# Patient Record
Sex: Female | Born: 1955 | Race: Black or African American | Hispanic: No | Marital: Single | Smoking: Never smoker
Health system: Southern US, Community
[De-identification: ages and names within clinical notes are randomized; demographics above are authoritative.]

## PROBLEM LIST (undated history)

## (undated) DIAGNOSIS — R259 Unspecified abnormal involuntary movements: Secondary | ICD-10-CM

## (undated) DIAGNOSIS — G809 Cerebral palsy, unspecified: Secondary | ICD-10-CM

## (undated) DIAGNOSIS — Z7984 Long term (current) use of oral hypoglycemic drugs: Secondary | ICD-10-CM

## (undated) DIAGNOSIS — E119 Type 2 diabetes mellitus without complications: Secondary | ICD-10-CM

## (undated) DIAGNOSIS — I10 Essential (primary) hypertension: Secondary | ICD-10-CM

## (undated) HISTORY — DX: Morbid (severe) obesity due to excess calories: E66.01

## (undated) HISTORY — DX: Long term (current) use of oral hypoglycemic drugs: Z79.84

## (undated) HISTORY — DX: Cerebral palsy, unspecified: G80.9

## (undated) HISTORY — PX: SALPINGOOPHORECTOMY: SHX82

## (undated) HISTORY — PX: ABDOMINAL HYSTERECTOMY: SHX81

---

## 2005-02-04 ENCOUNTER — Ambulatory Visit: Payer: Self-pay | Admitting: Family Medicine

## 2005-10-22 ENCOUNTER — Ambulatory Visit: Payer: Self-pay | Admitting: Unknown Physician Specialty

## 2006-09-07 ENCOUNTER — Ambulatory Visit: Payer: Self-pay | Admitting: Internal Medicine

## 2008-02-10 ENCOUNTER — Ambulatory Visit: Payer: Self-pay | Admitting: Internal Medicine

## 2009-03-15 ENCOUNTER — Emergency Department: Payer: Self-pay | Admitting: Internal Medicine

## 2010-03-25 ENCOUNTER — Emergency Department: Payer: Self-pay | Admitting: Emergency Medicine

## 2010-05-13 ENCOUNTER — Ambulatory Visit: Payer: Self-pay | Admitting: Internal Medicine

## 2011-06-16 DIAGNOSIS — E785 Hyperlipidemia, unspecified: Secondary | ICD-10-CM | POA: Insufficient documentation

## 2011-06-16 DIAGNOSIS — G809 Cerebral palsy, unspecified: Secondary | ICD-10-CM | POA: Insufficient documentation

## 2011-06-16 DIAGNOSIS — I10 Essential (primary) hypertension: Secondary | ICD-10-CM | POA: Insufficient documentation

## 2011-06-16 DIAGNOSIS — R32 Unspecified urinary incontinence: Secondary | ICD-10-CM | POA: Insufficient documentation

## 2012-02-02 ENCOUNTER — Ambulatory Visit: Payer: Self-pay | Admitting: Family Medicine

## 2012-04-08 ENCOUNTER — Emergency Department: Payer: Self-pay | Admitting: Emergency Medicine

## 2013-06-17 ENCOUNTER — Ambulatory Visit: Payer: Self-pay | Admitting: Family Medicine

## 2014-01-25 DIAGNOSIS — Q669 Congenital deformity of feet, unspecified, unspecified foot: Secondary | ICD-10-CM | POA: Insufficient documentation

## 2014-08-16 DIAGNOSIS — G250 Essential tremor: Secondary | ICD-10-CM | POA: Insufficient documentation

## 2014-08-26 ENCOUNTER — Emergency Department: Payer: Self-pay | Admitting: Emergency Medicine

## 2016-02-12 ENCOUNTER — Other Ambulatory Visit: Payer: Self-pay | Admitting: Family Medicine

## 2016-02-12 DIAGNOSIS — Z1231 Encounter for screening mammogram for malignant neoplasm of breast: Secondary | ICD-10-CM

## 2016-02-29 ENCOUNTER — Ambulatory Visit
Admission: RE | Admit: 2016-02-29 | Discharge: 2016-02-29 | Disposition: A | Discharge status: Home / Self Care | Payer: Medicare Other | Source: Ambulatory Visit | Attending: Family Medicine | Admitting: Family Medicine

## 2016-02-29 DIAGNOSIS — Z1231 Encounter for screening mammogram for malignant neoplasm of breast: Secondary | ICD-10-CM | POA: Diagnosis present

## 2016-04-03 ENCOUNTER — Emergency Department
Admission: EM | Admit: 2016-04-03 | Discharge: 2016-04-03 | Disposition: A | Discharge status: Home / Self Care | Payer: Medicare Other | Attending: Emergency Medicine | Admitting: Emergency Medicine

## 2016-04-03 ENCOUNTER — Encounter: Payer: Self-pay | Admitting: Emergency Medicine

## 2016-04-03 DIAGNOSIS — Z048 Encounter for examination and observation for other specified reasons: Secondary | ICD-10-CM | POA: Diagnosis present

## 2016-04-03 DIAGNOSIS — E785 Hyperlipidemia, unspecified: Secondary | ICD-10-CM | POA: Diagnosis not present

## 2016-04-03 DIAGNOSIS — Y999 Unspecified external cause status: Secondary | ICD-10-CM | POA: Insufficient documentation

## 2016-04-03 DIAGNOSIS — Y9289 Other specified places as the place of occurrence of the external cause: Secondary | ICD-10-CM | POA: Insufficient documentation

## 2016-04-03 DIAGNOSIS — Y939 Activity, unspecified: Secondary | ICD-10-CM | POA: Diagnosis not present

## 2016-04-03 DIAGNOSIS — Z8669 Personal history of other diseases of the nervous system and sense organs: Secondary | ICD-10-CM | POA: Insufficient documentation

## 2016-04-03 DIAGNOSIS — I10 Essential (primary) hypertension: Secondary | ICD-10-CM | POA: Insufficient documentation

## 2016-04-03 DIAGNOSIS — W050XXA Fall from non-moving wheelchair, initial encounter: Secondary | ICD-10-CM | POA: Insufficient documentation

## 2016-04-03 DIAGNOSIS — W19XXXA Unspecified fall, initial encounter: Secondary | ICD-10-CM

## 2016-04-03 DIAGNOSIS — E119 Type 2 diabetes mellitus without complications: Secondary | ICD-10-CM | POA: Insufficient documentation

## 2016-04-03 HISTORY — DX: Essential (primary) hypertension: I10

## 2016-04-03 HISTORY — DX: Unspecified abnormal involuntary movements: R25.9

## 2016-04-03 HISTORY — DX: Type 2 diabetes mellitus without complications: E11.9

## 2016-04-03 HISTORY — DX: Cerebral palsy, unspecified: G80.9

## 2016-04-03 NOTE — Discharge Instructions (Signed)

## 2016-04-03 NOTE — ED Provider Notes (Signed)
[facility] Emergency Department Provider Note   ____________________________________________  Time seen: Approximately 7:36 AM  I have reviewed the triage vital signs and the nursing notes.   HISTORY  Chief Complaint Fall and Diarrhea    HPI Sarah Landry is a 60 y.o. female from [facility] who had an unwitnessed fall. Patient had an episode of loose stool when she was picked up by EMS. Here patient has no complaints. Patient's been waiting for over an hour has not had a stool here I checked. She has no complaints of pain anywhere either and is acting normally. I will discharge her home.  Past Medical History  Diagnosis Date  . Diabetes mellitus without complication (HCC)   . Hypertension   . Cerebral palsy, unspecified (HCC)   . 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   History reviewed. No pertinent past surgical history.  No current outpatient prescriptions on file.  Allergies Review of patient's allergies indicates no known allergies.  Family History  Problem Relation Age of Onset  . Breast cancer Neg Hx     Social History Social History  Substance Use Topics  . Smoking status: None  . Smokeless tobacco: None  . Alcohol Use: None    Review of Systems Constitutional: No fever/chills Eyes: No visual changes. ENT: No sore throat. Cardiovascular: Denies chest pain. Respiratory: Denies shortness of breath. Gastrointestinal: No abdominal pain.  No nausea, no vomiting.  No diarrhea.  No  constipation. Genitourinary: Negative for dysuria. Musculoskeletal: Negative for back pain. Skin: Negative for rash. Neurological: Negative for headaches, focal weakness or numbness.  10-point ROS otherwise negative.  ____________________________________________   PHYSICAL EXAM:  VITAL SIGNS: ED Triage Vitals  Enc Vitals Group     BP 04/03/16 0621 154/85 mmHg     Pulse Rate 04/03/16 0621 85     Resp 04/03/16 0621 17     Temp 04/03/16 0621 97.7 F (36.5 C)     Temp Source 04/03/16 0621 Oral     SpO2 04/03/16 0621 97 %     Weight 04/03/16 0621 235 lb 11.2 oz (106.913 kg)     Height 04/03/16 0621 5' (1.524 m)     Head Cir --      Peak Flow --      Pain Score 04/03/16 0618 0     Pain Loc --      Pain Edu? --      Excl. in GC? --     Constitutional: Alert and oriented. Well appearing and in no acute distress. Eyes: Conjunctivae are normal. PERRL. EOMI. Head: Atraumatic. Nose: No congestion/rhinnorhea. Mouth/Throat: Mucous membranes are moist.  Oropharynx non-erythematous. Neck: No stridor.  No cervical spine tenderness to palpation. Cardiovascular: Normal rate, regular rhythm. Grossly normal heart sounds.  Good peripheral circulation. Respiratory: Normal respiratory effort.  No retractions. Lungs CTAB. Gastrointestinal: Soft and nontender. No distention. No abdominal bruits. No CVA tenderness. Musculoskeletal: No lower extremity tenderness nor edema.  No joint effusions. Neurologic:  Normal speech and language. No gross focal neurologic deficits are appreciated. No gait instability. Skin:  Skin is warm, dry and intact.  No rash noted. Psychiatric: Mood and affect are normal. Speech and behavior are normal.  ____________________________________________   LABS (all labs ordered are listed, but only abnormal results are displayed)  Labs Reviewed - No data to  display ____________________________________________  EKG   ____________________________________________  RADIOLOGY  ____________________________________________   PROCEDURES    ____________________________________________   INITIAL IMPRESSION / ASSESSMENT AND PLAN / ED COURSE  Pertinent labs & imaging results that were available during my care of the patient were reviewed by me and considered in my medical decision making (see chart for details).   ____________________________________________   FINAL CLINICAL IMPRESSION(S) / ED DIAGNOSES  Final diagnoses:  Fall, initial encounter      NEW MEDICATIONS STARTED DURING THIS VISIT:  There are no discharge medications for this patient.    Note:  This document was prepared using Dragon voice recognition software and may include unintentional dictation errors.    Arnaldo Natal, MD 04/03/16 [phone]

## 2016-04-03 NOTE — ED Notes (Signed)
Pt arrived via EMS from [facility] after falling. Pt reports that she was transferring from her wheelchair to the toilet when she slipped out of the wheelchair and fell to the floor. Pt denies hitting her head and denies any injury, denies any pain at this time. Pt had very runny BM as EMS was getting her off the floor. Pt reports that this started today, denies abd pain, N/V.

## 2018-06-18 DIAGNOSIS — Z789 Other specified health status: Secondary | ICD-10-CM | POA: Insufficient documentation

## 2021-04-10 ENCOUNTER — Other Ambulatory Visit: Payer: Self-pay | Admitting: Internal Medicine

## 2021-04-10 DIAGNOSIS — Z1231 Encounter for screening mammogram for malignant neoplasm of breast: Secondary | ICD-10-CM

## 2021-04-18 ENCOUNTER — Ambulatory Visit
Admission: RE | Admit: 2021-04-18 | Discharge: 2021-04-18 | Disposition: A | Discharge status: Home / Self Care | Payer: Medicare (Managed Care) | Source: Ambulatory Visit | Attending: Internal Medicine | Admitting: Internal Medicine

## 2021-04-18 ENCOUNTER — Other Ambulatory Visit: Payer: Self-pay

## 2021-04-18 DIAGNOSIS — Z1231 Encounter for screening mammogram for malignant neoplasm of breast: Secondary | ICD-10-CM | POA: Insufficient documentation

## 2022-02-26 IMAGING — MG MM DIGITAL SCREENING BILAT W/ TOMO AND CAD
8 of 15 series · 8 of 40 positions shown · non-contrast
Comparison: Previous exam(s).

ACR Breast Density Category a: The breast tissue is almost entirely
fatty.

CLINICAL DATA: Screening.

EXAM:
DIGITAL SCREENING BILATERAL MAMMOGRAM WITH TOMOSYNTHESIS AND CAD
TECHNIQUE: Bilateral screening digital craniocaudal and mediolateral oblique
mammograms were obtained. Bilateral screening digital breast
tomosynthesis was performed. The images were evaluated with
computer-aided detection.

[L CC synth-2D (1 of 2)]
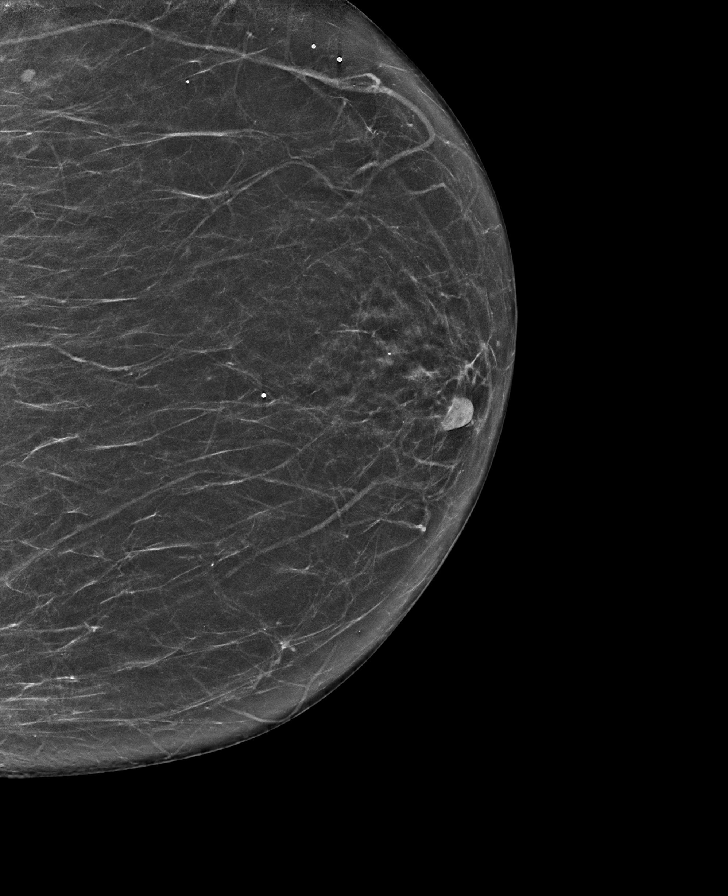

[R MLO synth-2D (1 of 2)]
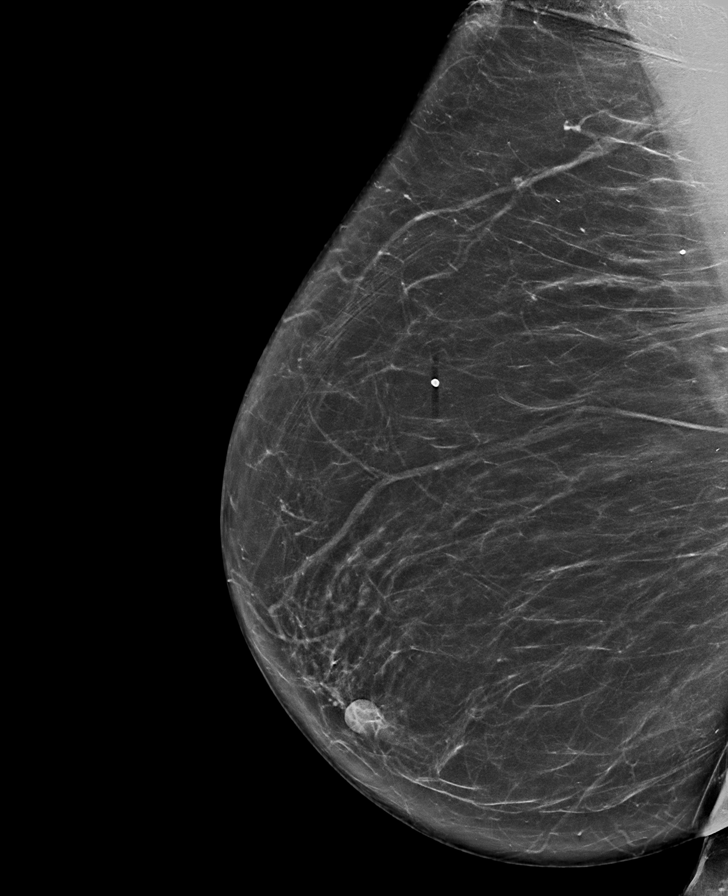

[L CC synth-2D (2 of 2)]
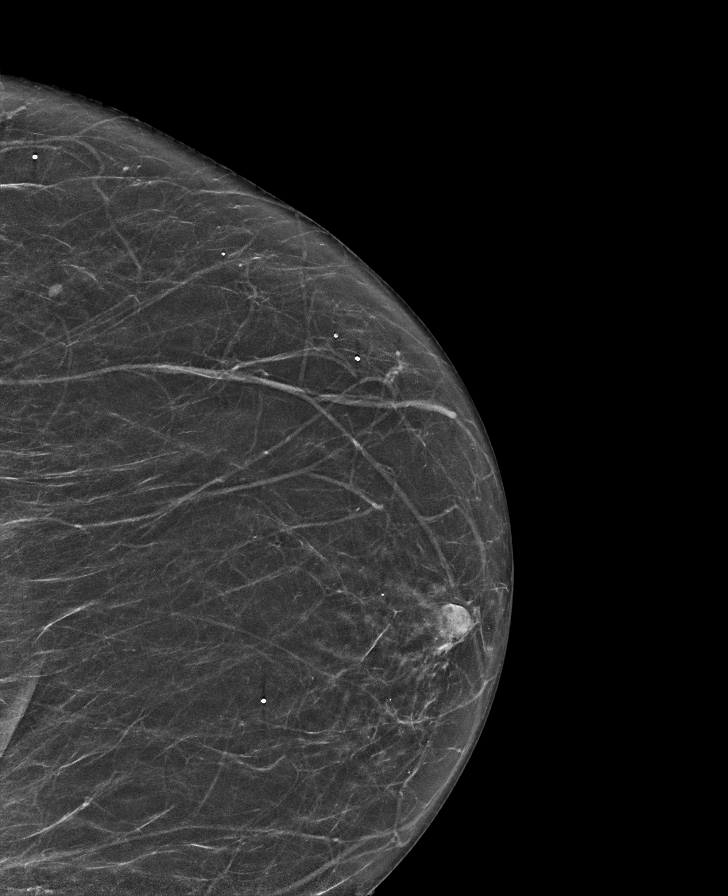

[R CC synth-2D (1 of 2)]
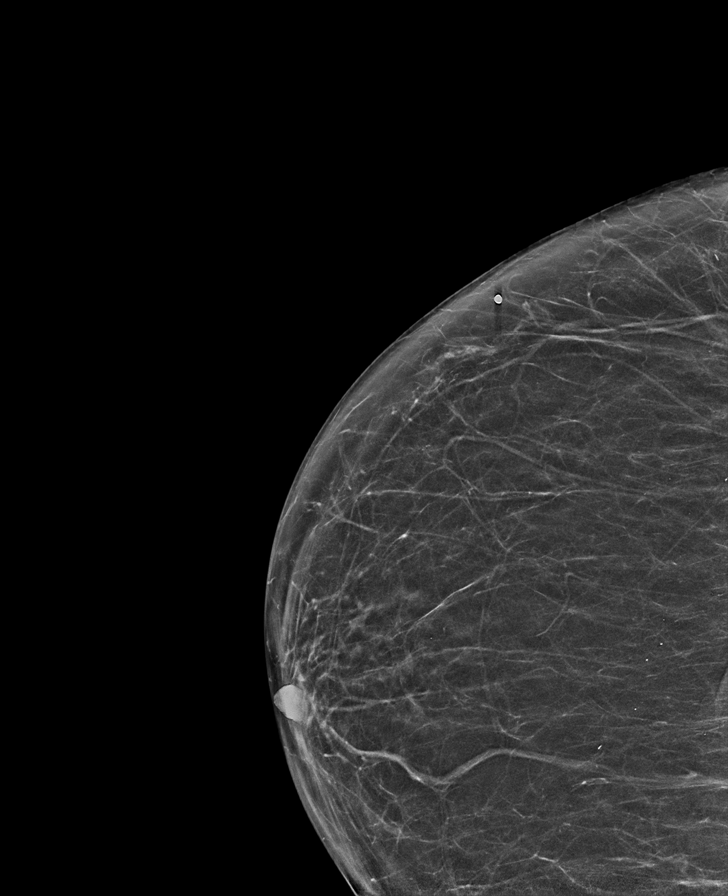

[R MLO synth-2D (2 of 2)]
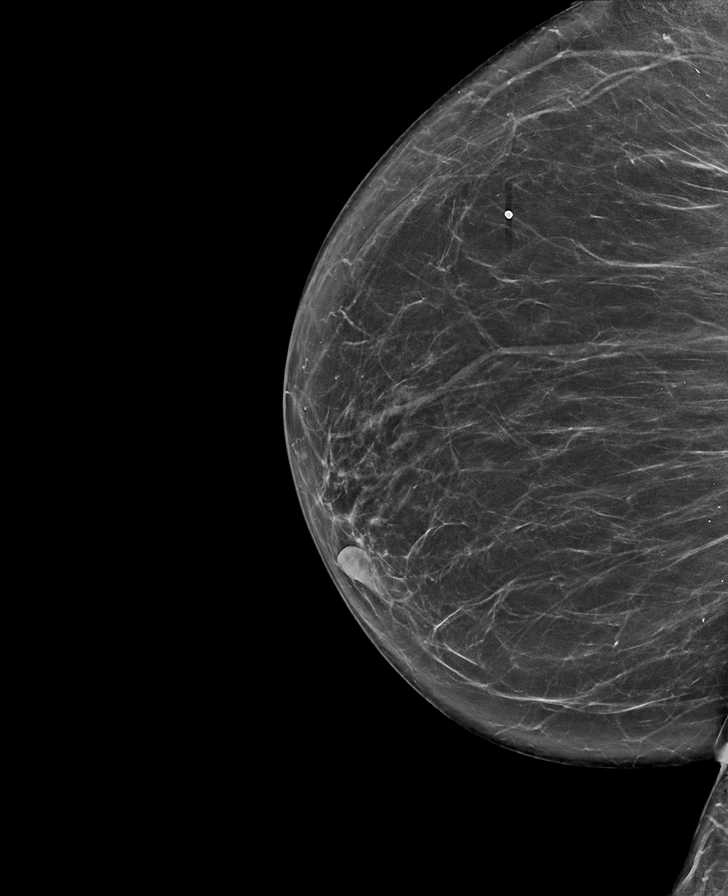

[R CC synth-2D (2 of 2)]
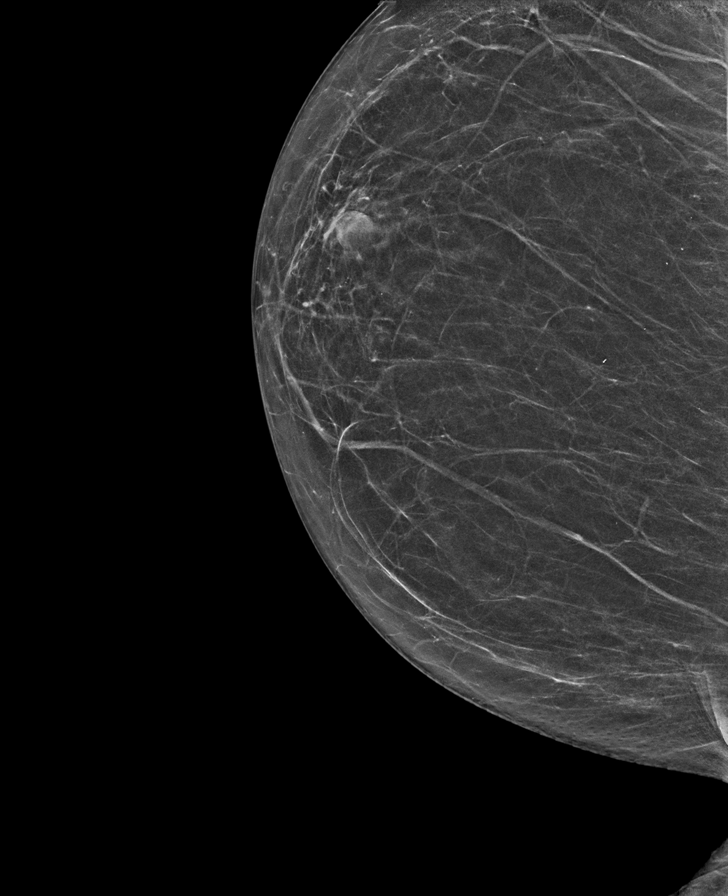

[L MLO synth-2D]
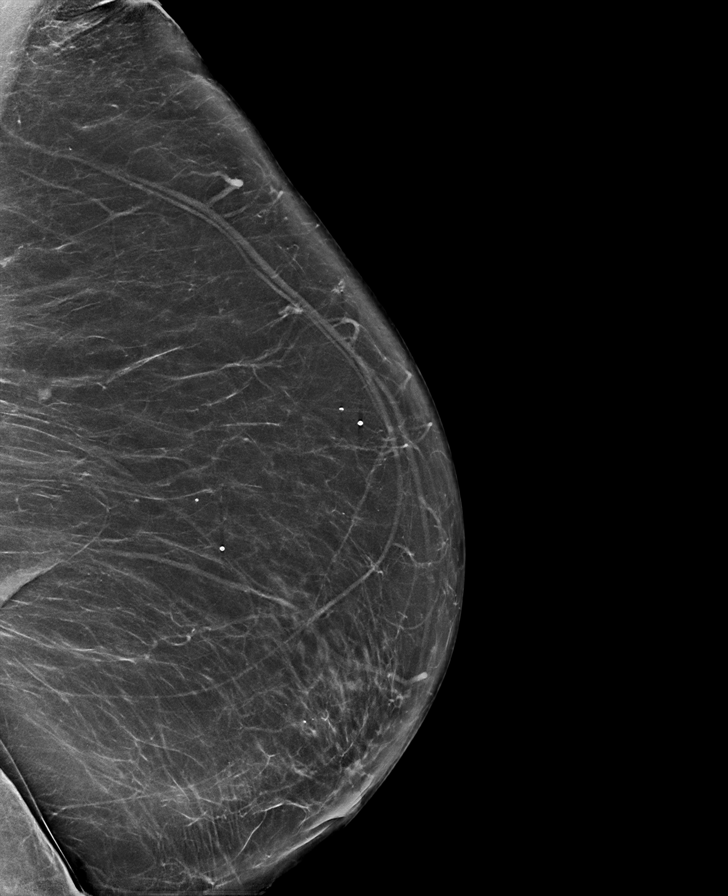

[R MLO tomo · tomo slice 58/85.0]
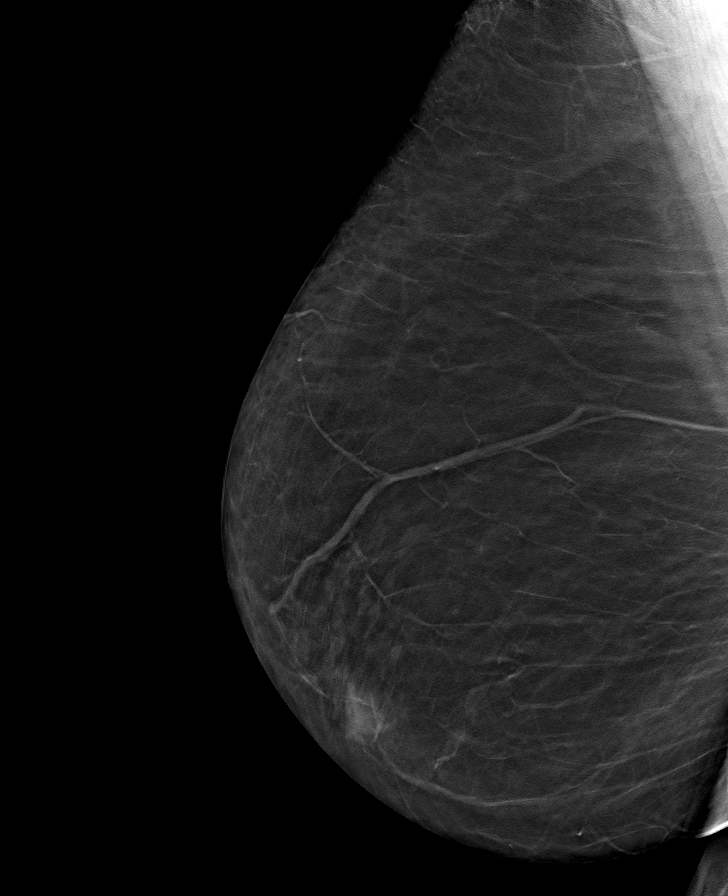

[8 of 40 positions shown; findings below may reference images not displayed]

FINDINGS: There are no findings suspicious for malignancy.
IMPRESSION: No mammographic evidence of malignancy. A result letter of this
screening mammogram will be mailed directly to the patient.

RECOMMENDATION:
Screening mammogram in one year. (Code:0E-3-N98)

BI-RADS CATEGORY  1: Negative.

## 2022-06-06 ENCOUNTER — Other Ambulatory Visit: Payer: Self-pay | Admitting: Nurse Practitioner

## 2022-06-06 ENCOUNTER — Other Ambulatory Visit: Payer: Self-pay | Admitting: Emergency Medicine

## 2022-06-06 DIAGNOSIS — Z1231 Encounter for screening mammogram for malignant neoplasm of breast: Secondary | ICD-10-CM

## 2022-07-04 ENCOUNTER — Ambulatory Visit
Admission: RE | Admit: 2022-07-04 | Discharge: 2022-07-04 | Disposition: A | Discharge status: Home / Self Care | Payer: Medicare (Managed Care) | Source: Ambulatory Visit | Attending: Nurse Practitioner | Admitting: Nurse Practitioner

## 2022-07-04 DIAGNOSIS — Z1231 Encounter for screening mammogram for malignant neoplasm of breast: Secondary | ICD-10-CM | POA: Diagnosis not present

## 2022-10-15 ENCOUNTER — Encounter (INDEPENDENT_AMBULATORY_CARE_PROVIDER_SITE_OTHER): Payer: Self-pay

## 2022-10-15 ENCOUNTER — Encounter (INDEPENDENT_AMBULATORY_CARE_PROVIDER_SITE_OTHER): Payer: Medicare (Managed Care) | Admitting: Nurse Practitioner

## 2022-11-05 ENCOUNTER — Other Ambulatory Visit (INDEPENDENT_AMBULATORY_CARE_PROVIDER_SITE_OTHER): Payer: Self-pay | Admitting: Nurse Practitioner

## 2022-11-05 DIAGNOSIS — I739 Peripheral vascular disease, unspecified: Secondary | ICD-10-CM

## 2022-11-07 ENCOUNTER — Encounter (INDEPENDENT_AMBULATORY_CARE_PROVIDER_SITE_OTHER): Payer: Self-pay | Admitting: Nurse Practitioner

## 2022-11-07 ENCOUNTER — Ambulatory Visit (INDEPENDENT_AMBULATORY_CARE_PROVIDER_SITE_OTHER): Payer: Self-pay

## 2022-11-07 ENCOUNTER — Ambulatory Visit (INDEPENDENT_AMBULATORY_CARE_PROVIDER_SITE_OTHER): Payer: Medicare (Managed Care) | Admitting: Nurse Practitioner

## 2022-11-07 VITALS — BP 110/71 | HR 65

## 2022-11-07 DIAGNOSIS — I739 Peripheral vascular disease, unspecified: Secondary | ICD-10-CM

## 2022-11-07 DIAGNOSIS — I10 Essential (primary) hypertension: Secondary | ICD-10-CM | POA: Diagnosis not present

## 2022-11-07 DIAGNOSIS — E785 Hyperlipidemia, unspecified: Secondary | ICD-10-CM | POA: Diagnosis not present

## 2022-11-23 ENCOUNTER — Encounter (INDEPENDENT_AMBULATORY_CARE_PROVIDER_SITE_OTHER): Payer: Self-pay | Admitting: Nurse Practitioner

## 2022-11-23 NOTE — Progress Notes (Signed)
Subjective:    Patient ID: Sarah Landry, female    DOB: Sep 07, 1956, 67 y.o.   MRN: [id number] Chief Complaint  Patient presents with   New Patient (Initial Visit)    ABI and consult PVD referred by Sarah Landry is a 67 year old female who presents today for evaluation due to having diminished pulses.  It was recommended by podiatry that she be seen for evaluation of possible peripheral vascular disease.  With a history of cerebral palsy as well as coronary artery disease.  Patient does not have complaints of significant claudication-like symptoms.  She has some pain in her lower extremities but these are not consistent with rest pain symptoms.  Today the patient has ABI of 1.16 on the right and 1.14 on the left.  She has biphasic tibial artery waveforms bilaterally with normal toe waveforms bilaterally.    Review of Systems  Neurological:  Positive for weakness.  All other systems reviewed and are negative.      Objective:   Physical Exam Vitals reviewed.  HENT:     Head: Normocephalic.  Cardiovascular:     Rate and Rhythm: Normal rate.     Pulses:          Dorsalis pedis pulses are detected w/ Doppler on the right side and detected w/ Doppler on the left side.       Posterior tibial pulses are detected w/ Doppler on the right side and detected w/ Doppler on the left side.  Pulmonary:     Effort: Pulmonary effort is normal.  Skin:    General: Skin is warm and dry.  Neurological:     Mental Status: She is alert and oriented to person, place, and time.  Psychiatric:        Mood and Affect: Mood normal.        Behavior: Behavior normal.        Thought Content: Thought content normal.        Judgment: Judgment normal.     BP 110/71   Pulse 65   Past Medical History:  Diagnosis Date   Cerebral palsy (HCC)    Cerebral palsy, unspecified ([place])    Diabetes mellitus without complication ([place])    Hypertension    Involuntary movements    Long term  current use of oral hypoglycemic drug    Morbid obesity ([place])     Social History   Socioeconomic History   Marital status: Single    Spouse name: Not on file   Number of children: Not on file   Years of education: Not on file   Highest education level: Not on file  Occupational History   Not on file  Tobacco Use   Smoking status: Never   Smokeless tobacco: Never  Vaping Use   Vaping Use: Never used  Substance and Sexual Activity   Alcohol use: Not Currently   Drug use: Never   Sexual activity: Not Currently  Other Topics Concern   Not on file  Social History Narrative   Not on file   Social Determinants of Health   Financial Resource Strain: Not on file  Food Insecurity: Not on file  Transportation Needs: Not on file  Physical Activity: Not on file  Stress: Not on file  Social Connections: Not on file  Intimate Partner Violence: Not on file    Past Surgical History:  Procedure Laterality Date   ABDOMINAL HYSTERECTOMY     SALPINGOOPHORECTOMY  Family History  Problem Relation Age of Onset   Diabetes Mother    Pancreatic cancer Mother    Diabetes Father    Lung cancer Father    Diabetes Sister    Heart attack Sister    Diabetes Brother    Heart attack Paternal Aunt    Breast cancer Neg Hx     Allergies  Allergen Reactions   Lactose Intolerance (Gi)         No data to display            CMP  No results found for: "NA", "K", "CL", "CO2", "GLUCOSE", "BUN", "CREATININE", "CALCIUM", "PROT", "ALBUMIN", "AST", "ALT", "ALKPHOS", "BILITOT", "GFRNONAA", "GFRAA"   VAS [place] ABI WITH/WO TBI  Result Date: 11/10/2022  LOWER EXTREMITY DOPPLER STUDY Patient Name:  Sarah Landry  Date of Exam:   11/07/2022 Medical Rec #: [id number]        Accession #:    [id number] Date of Birth: 1956-05-29         Patient Gender: F Patient Age:   62 years Exam Location:  [facility] Procedure:      VAS [place] ABI WITH/WO TBI Referring Phys:  --------------------------------------------------------------------------------  Indications: Rest pain.  Performing Technologist: Concha Norway RVT  Examination Guidelines: A complete evaluation includes at minimum, Doppler waveform signals and systolic blood pressure reading at the level of bilateral brachial, anterior tibial, and posterior tibial arteries, when vessel segments are accessible. Bilateral testing is considered an integral part of a complete examination. Photoelectric Plethysmograph (PPG) waveforms and toe systolic pressure readings are included as required and additional duplex testing as needed. Limited examinations for reoccurring indications may be performed as noted.  ABI Findings: +---------+------------------+-----+--------+--------+ Right    Rt Pressure (mmHg)IndexWaveformComment  +---------+------------------+-----+--------+--------+ Brachial 117                                     +---------+------------------+-----+--------+--------+ ATA      139               1.16 biphasic         +---------+------------------+-----+--------+--------+ PTA      133               1.11 biphasic         +---------+------------------+-----+--------+--------+ Great Toe97                0.81 Normal           +---------+------------------+-----+--------+--------+ +---------+------------------+-----+--------+-------+ Left     Lt Pressure (mmHg)IndexWaveformComment +---------+------------------+-----+--------+-------+ Brachial 120                                    +---------+------------------+-----+--------+-------+ ATA      137               1.14 biphasic        +---------+------------------+-----+--------+-------+ PTA      136               1.13 biphasic        +---------+------------------+-----+--------+-------+ Great Toe88                0.73 Normal          +---------+------------------+-----+--------+-------+  Summary: Right: Resting right  ankle-brachial index is within normal range. The right toe-brachial index is normal. Left: Resting left ankle-brachial index is within  normal range. The left toe-brachial index is normal. *See table(s) above for measurements and observations.  Electronically signed by Festus Barren MD on 11/10/2022 at 7:13:47 AM.    Final        Assessment & Plan:   1. PVD (peripheral vascular disease) (HCC) Today the patient has evidence of some mild peripheral arterial disease but no extensive disease.  Based on her noninvasive studies currently there is no role for angina.  The patient continues to have lower extremity pain she is advised to have further workup done by her primary specialist to evaluate for possible lower back issues.  Will have the patient return in 3 months for noninvasive studies. - VAS [place] ABI WITH/WO TBI  2. Primary hypertension Continue antihypertensive medications as already ordered, these medications have been reviewed and there are no changes at this time.  3. Hyperlipidemia, unspecified hyperlipidemia type Continue statin as ordered and reviewed, no changes at this time   Current Outpatient Medications on File Prior to Visit  Medication Sig Dispense Refill   aspirin EC 81 MG tablet Take by mouth.     atorvastatin (LIPITOR) 40 MG tablet Take 40 mg by mouth daily.     Cranberry 400 MG CAPS Take 1 capsule by mouth daily.     furosemide (LASIX) 20 MG tablet Take 10 mg by mouth as directed. Every other day     GLUCAGON, RDNA, IJ Inject as directed as needed.     hydrochlorothiazide (HYDRODIURIL) 25 MG tablet Take 25 mg by mouth daily.     metFORMIN (GLUCOPHAGE) 500 MG tablet Take 500 mg by mouth 2 (two) times daily.     metoprolol succinate (TOPROL-XL) 25 MG 24 hr tablet Take 25 mg by mouth daily.     oxybutynin (DITROPAN) 5 MG tablet Take 5 mg by mouth daily.     No current facility-administered medications on file prior to visit.    There are no Patient Instructions on file for  this visit. No follow-ups on file.   Georgiana Spinner, NP

## 2023-02-03 ENCOUNTER — Encounter (INDEPENDENT_AMBULATORY_CARE_PROVIDER_SITE_OTHER): Payer: Self-pay | Admitting: Vascular Surgery

## 2023-02-03 ENCOUNTER — Ambulatory Visit (INDEPENDENT_AMBULATORY_CARE_PROVIDER_SITE_OTHER): Payer: Medicare (Managed Care) | Admitting: Vascular Surgery

## 2023-02-03 VITALS — BP 139/77 | HR 51 | Resp 18 | Ht 63.0 in | Wt 217.0 lb

## 2023-02-03 DIAGNOSIS — I10 Essential (primary) hypertension: Secondary | ICD-10-CM | POA: Diagnosis not present

## 2023-02-03 DIAGNOSIS — G809 Cerebral palsy, unspecified: Secondary | ICD-10-CM | POA: Diagnosis not present

## 2023-02-03 DIAGNOSIS — M7989 Other specified soft tissue disorders: Secondary | ICD-10-CM

## 2023-02-03 DIAGNOSIS — E785 Hyperlipidemia, unspecified: Secondary | ICD-10-CM

## 2023-02-03 NOTE — Progress Notes (Signed)
MRN : [id number]  Sarah Landry is a 67 y.o. (1956/07/24) female who presents with chief complaint of  Chief Complaint  Patient presents with   Follow-up    f/u in 3 months with no studies  .  History of Present Illness: Patient returns today in follow up of her leg swelling.  Her sister provides the history.  She lives in a nursing facility and is nonambulatory essentially at this point.  Her sister says she basically gets up in the morning and then sits with her legs dependent much of the day.  At one point, they were using compression hose but now they are not and her sister says they are not sure where they are at.  Not surprisingly, her swelling is no better and actually a little worse.  Both legs are affected.  No ulceration.  Difficult to discern if this hurts her.  Current Outpatient Medications  Medication Sig Dispense Refill   aspirin EC 81 MG tablet Take by mouth.     atorvastatin (LIPITOR) 40 MG tablet Take 40 mg by mouth daily.     Cranberry 400 MG CAPS Take 1 capsule by mouth daily.     furosemide (LASIX) 20 MG tablet Take 10 mg by mouth as directed. Every other day     GLUCAGON, RDNA, IJ Inject as directed as needed.     hydrochlorothiazide (HYDRODIURIL) 25 MG tablet Take 25 mg by mouth daily.     Lactase (LACTAID FAST ACT) 9000 units CHEW Chew 9,000 Units by mouth as needed.     losartan (COZAAR) 100 MG tablet Take 100 mg by mouth daily.     metFORMIN (GLUCOPHAGE) 500 MG tablet Take 500 mg by mouth 2 (two) times daily.     metoprolol succinate (TOPROL-XL) 25 MG 24 hr tablet Take 25 mg by mouth daily.     oxybutynin (DITROPAN) 5 MG tablet Take 5 mg by mouth daily.     No current facility-administered medications for this visit.    Past Medical History:  Diagnosis Date   Cerebral palsy    Cerebral palsy, unspecified    Diabetes mellitus without complication    Hypertension    Involuntary movements    Long term current use of oral hypoglycemic drug    Morbid  obesity     Past Surgical History:  Procedure Laterality Date   ABDOMINAL HYSTERECTOMY     SALPINGOOPHORECTOMY       Social History   Tobacco Use   Smoking status: Never   Smokeless tobacco: Never  Vaping Use   Vaping Use: Never used  Substance Use Topics   Alcohol use: Not Currently   Drug use: Never       Family History  Problem Relation Age of Onset   Diabetes Mother    Pancreatic cancer Mother    Diabetes Father    Lung cancer Father    Diabetes Sister    Heart attack Sister    Diabetes Brother    Heart attack Paternal Aunt    Breast cancer Neg Hx      Allergies  Allergen Reactions   Lactose Intolerance (Gi)      REVIEW OF SYSTEMS (Negative unless checked)  Constitutional: [] Weight loss  [] Fever  [] Chills Cardiac: [] Chest pain   [] Chest pressure   [] Palpitations   [] Shortness of breath when laying flat   [] Shortness of breath at rest   [] Shortness of breath with exertion. Vascular:  [] Pain in legs with walking   []   Pain in legs at rest   [] Pain in legs when laying flat   [] Claudication   [] Pain in feet when walking  [] Pain in feet at rest  [] Pain in feet when laying flat   [] History of DVT   [] Phlebitis   [] Swelling in legs   [] Varicose veins   [] Non-healing ulcers Pulmonary:   [] Uses home oxygen   [] Productive cough   [] Hemoptysis   [] Wheeze  [] COPD   [] Asthma Neurologic:  [] Dizziness  [] Blackouts   [] Seizures   [] History of stroke   [] History of TIA  [] Aphasia   [] Temporary blindness   [] Dysphagia   [x] Weakness or numbness in arms   [x] Weakness or numbness in legs Musculoskeletal:  [] Arthritis   [] Joint swelling   [] Joint pain   [] Low back pain Hematologic:  [] Easy bruising  [] Easy bleeding   [] Hypercoagulable state   [] Anemic   Gastrointestinal:  [] Blood in stool   [] Vomiting blood  [] Gastroesophageal reflux/heartburn   [] Abdominal pain Genitourinary:  [] Chronic kidney disease   [] Difficult urination  [] Frequent urination  [] Burning with urination    [] Hematuria Skin:  [] Rashes   [] Ulcers   [] Wounds Psychological:  [] History of anxiety   []  History of major depression.  Physical Examination  BP 139/77 (BP Location: Right Arm)   Pulse (!) 51   Resp 18   Ht 5\' 3"  (1.6 m)   Wt 217 lb (98.4 kg)   BMI 38.44 kg/m  Gen:  WD/WN, NAD Head: [place]/AT, No temporalis wasting. Ear/Nose/Throat: Hearing grossly intact, nares w/o erythema or drainage Eyes: Conjunctiva clear. Sclera non-icteric Neck: Supple.  Trachea midline Pulmonary:  Good air movement, no use of accessory muscles.  Cardiac: RRR, no JVD Vascular:  Vessel Right Left  Radial Palpable Palpable               Musculoskeletal: M/S 5/5 throughout.  Some spasticity present.  1-2+ bilateral lower extremity Neurologic: Sensation grossly intact in extremities.  Symmetrical.  Speech is fluent.  Psychiatric: Judgment and insight are poor Dermatologic: No rashes or ulcers noted.  No cellulitis or open wounds.      Labs No results found for this or any previous visit (from the past 2160 hour(s)).  Radiology No results found.  Assessment/Plan  Swelling of limb She has significant swelling in both lower extremities.  Given her immobility and chronic dependency of her legs, this is not entirely surprising.  Compression socks and elevation would be of great benefit to get her swelling under better control.  I discussed with her sister she will likely always have some degree of swelling.  She may benefit from a lymphedema pump going forward if her swelling does not improve.  Return to clinic in 3 to 6 months.  Cerebral palsy (HCC) In a nursing facility.  Essentially nonambulatory.  HTN (hypertension) blood pressure control important in reducing the progression of atherosclerotic disease. On appropriate oral medications.   Hyperlipemia lipid control important in reducing the progression of atherosclerotic disease. Continue statin therapy    Festus Barren, MD  02/05/2023 7:13  AM    This note was created with Dragon medical transcription system.  Any errors from dictation are purely unintentional

## 2023-02-05 DIAGNOSIS — M7989 Other specified soft tissue disorders: Secondary | ICD-10-CM | POA: Insufficient documentation

## 2023-02-05 NOTE — Assessment & Plan Note (Signed)
blood pressure control important in reducing the progression of atherosclerotic disease. On appropriate oral medications.  

## 2023-02-05 NOTE — Assessment & Plan Note (Signed)
She has significant swelling in both lower extremities.  Given her immobility and chronic dependency of her legs, this is not entirely surprising.  Compression socks and elevation would be of great benefit to get her swelling under better control.  I discussed with her sister she will likely always have some degree of swelling.  She may benefit from a lymphedema pump going forward if her swelling does not improve.  Return to clinic in 3 to 6 months.

## 2023-02-05 NOTE — Assessment & Plan Note (Signed)
In a nursing facility.  Essentially nonambulatory.

## 2023-02-05 NOTE — Assessment & Plan Note (Signed)
lipid control important in reducing the progression of atherosclerotic disease. Continue statin therapy  

## 2023-08-04 ENCOUNTER — Telehealth (INDEPENDENT_AMBULATORY_CARE_PROVIDER_SITE_OTHER): Payer: Self-pay

## 2023-08-04 NOTE — Telephone Encounter (Signed)
Please call Dondra Spry to cancel and reschedule appts for pt on 08/11/2023

## 2023-08-04 NOTE — Telephone Encounter (Signed)
Dondra Spry called back and cancelled appt on the 15th and said she would call back to reschedule

## 2023-08-10 ENCOUNTER — Other Ambulatory Visit (INDEPENDENT_AMBULATORY_CARE_PROVIDER_SITE_OTHER): Payer: Self-pay | Admitting: Vascular Surgery

## 2023-08-10 DIAGNOSIS — M7989 Other specified soft tissue disorders: Secondary | ICD-10-CM

## 2023-08-11 ENCOUNTER — Encounter (INDEPENDENT_AMBULATORY_CARE_PROVIDER_SITE_OTHER): Payer: Medicare (Managed Care)

## 2023-08-11 ENCOUNTER — Ambulatory Visit (INDEPENDENT_AMBULATORY_CARE_PROVIDER_SITE_OTHER): Payer: Medicare (Managed Care) | Admitting: Vascular Surgery
